# Patient Record
Sex: Male | Born: 2000 | Race: White | Hispanic: No | Marital: Single
Health system: Southern US, Community
[De-identification: ages and names within clinical notes are randomized; demographics above are authoritative.]

---
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
## 2019-04-14 ENCOUNTER — Other Ambulatory Visit: Payer: Self-pay | Admitting: *Deleted

## 2019-04-14 DIAGNOSIS — Z20822 Contact with and (suspected) exposure to covid-19: Secondary | ICD-10-CM

## 2019-04-16 LAB — NOVEL CORONAVIRUS, NAA: SARS-CoV-2, NAA: NOT DETECTED

## 2019-05-24 ENCOUNTER — Other Ambulatory Visit: Payer: Self-pay

## 2019-05-24 DIAGNOSIS — Z20822 Contact with and (suspected) exposure to covid-19: Secondary | ICD-10-CM

## 2019-05-25 ENCOUNTER — Telehealth: Payer: Self-pay

## 2019-05-25 LAB — NOVEL CORONAVIRUS, NAA: SARS-CoV-2, NAA: NOT DETECTED

## 2019-05-25 NOTE — Telephone Encounter (Signed)
Patient called requesting COVID19 lab results and MyChart setup assistance - DOB/Address verified - negative results given. Assisted w/MyChart set up, no further questions.

## 2019-07-14 ENCOUNTER — Ambulatory Visit: Payer: Medicaid Other | Attending: Internal Medicine

## 2019-07-14 ENCOUNTER — Other Ambulatory Visit: Payer: Self-pay

## 2019-07-14 DIAGNOSIS — Z20822 Contact with and (suspected) exposure to covid-19: Secondary | ICD-10-CM

## 2019-07-15 LAB — NOVEL CORONAVIRUS, NAA: SARS-CoV-2, NAA: DETECTED — AB
